# Patient Record
Sex: Female | Born: 2000 | Race: Black or African American | Hispanic: No | Marital: Single | Smoking: Never smoker
Health system: Southern US, Community
[De-identification: ages and names within clinical notes are randomized; demographics above are authoritative.]

## PROBLEM LIST (undated history)

## (undated) ENCOUNTER — Ambulatory Visit: Discharge status: Absent without leave | Payer: Medicaid Other

## (undated) DIAGNOSIS — D649 Anemia, unspecified: Secondary | ICD-10-CM

## (undated) HISTORY — PX: NO PAST SURGERIES: SHX2092

---
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
## 2022-06-27 IMAGING — US US OB TRANSVAGINAL
2 series · 15 of 28 positions shown · non-contrast
Comparison: Prior studies from [DATE] and 06/17/2020

CLINICAL DATA: Vaginal bleeding.  Positive pregnancy test.

EXAM:
OBSTETRIC <14 WK US AND TRANSVAGINAL OB US
TECHNIQUE: Both transabdominal and transvaginal ultrasound examinations were
performed for complete evaluation of the gestation as well as the
maternal uterus, adnexal regions, and pelvic cul-de-sac.
Transvaginal technique was performed to assess early pregnancy.

[Series 1: us ob transvaginal · 1 of 1 slices shown (1 of 2)]
[im 1/1]
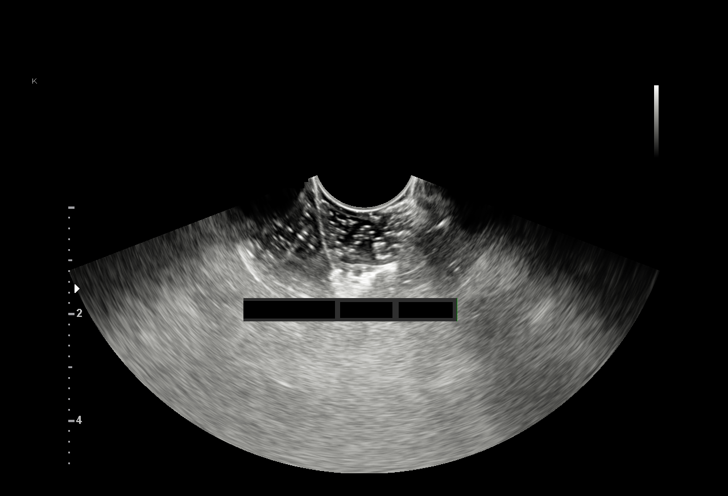

[Series 2: us ob transvaginal · 14 of 41 slices shown (2 of 2)]
[im 2/41]
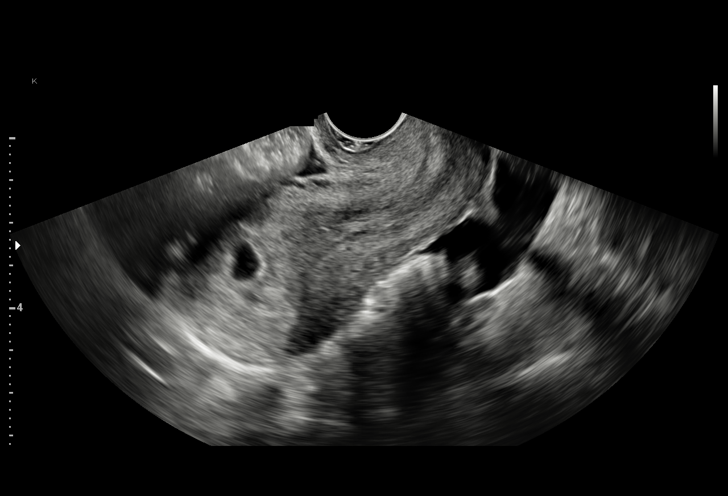
[im 5/41]
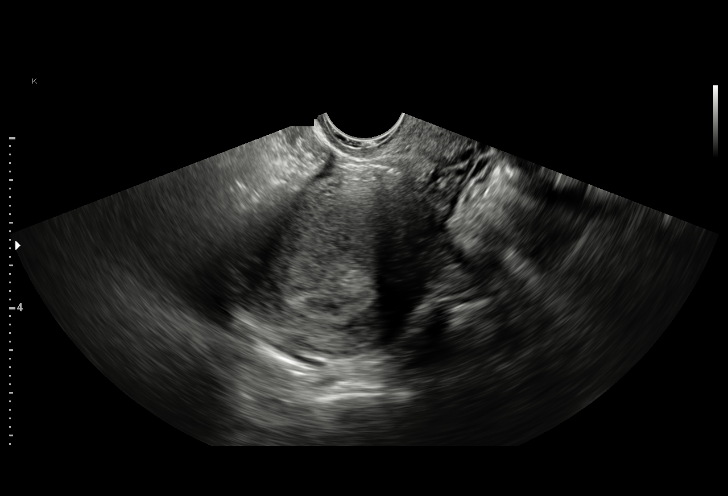
[im 8/41]
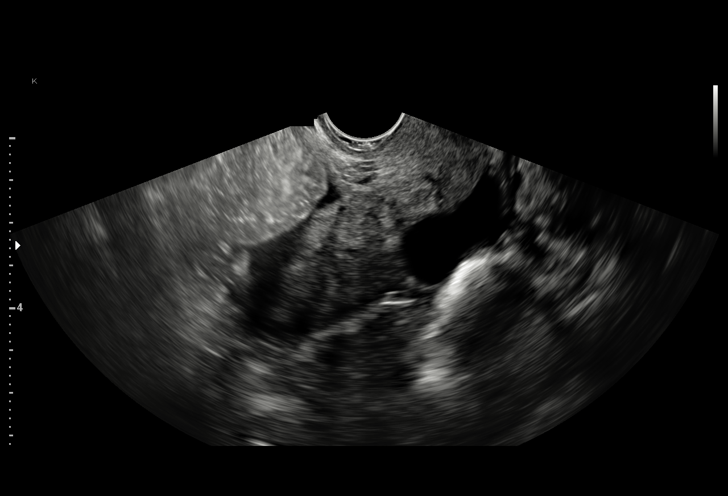
[im 11/41]
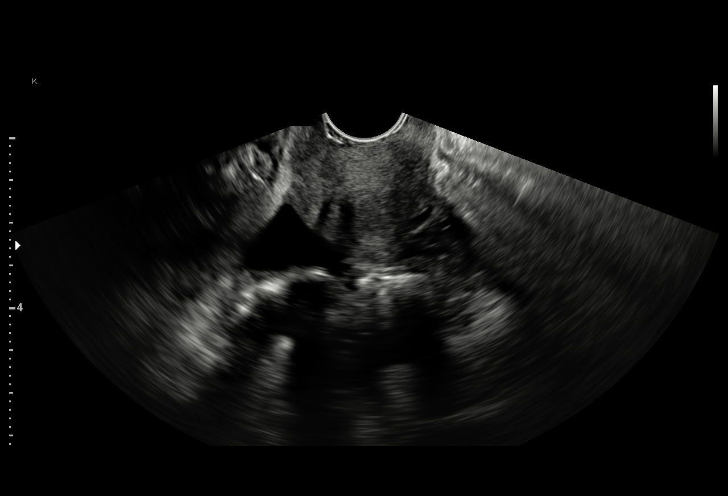
[im 14/41]
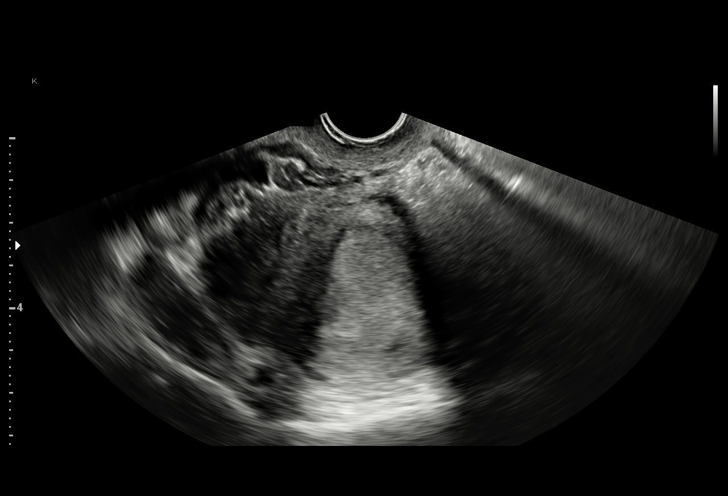
[im 17/41]
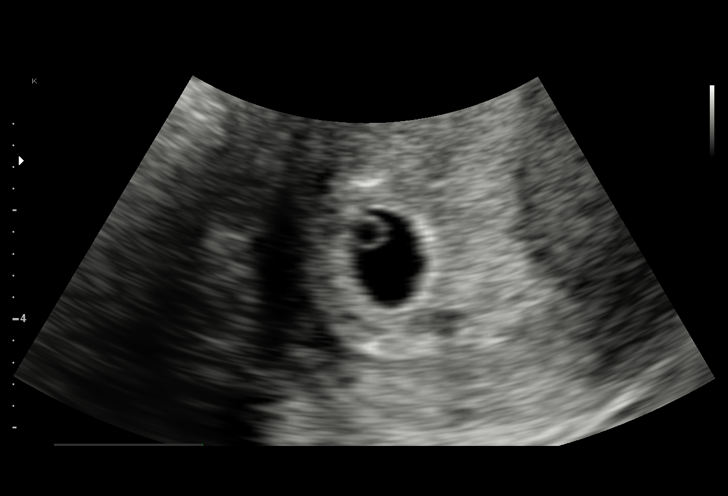
[im 21/41]
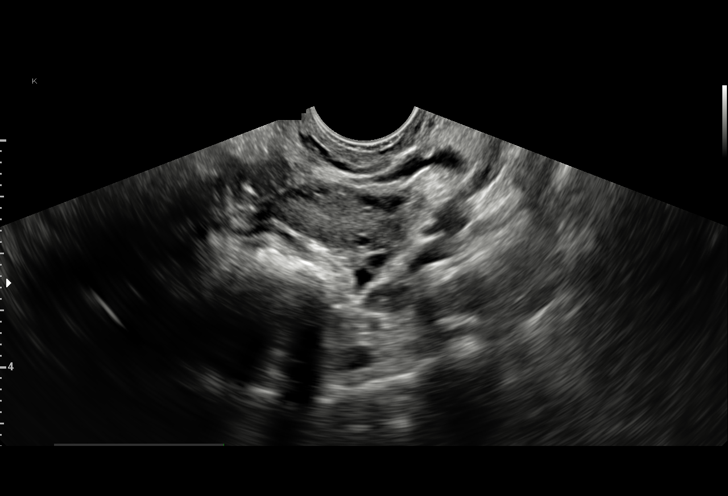
[im 22/41]
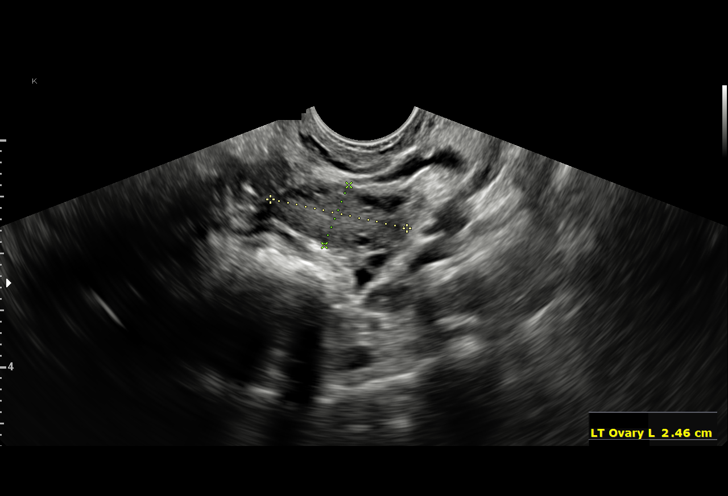
[im 25/41]
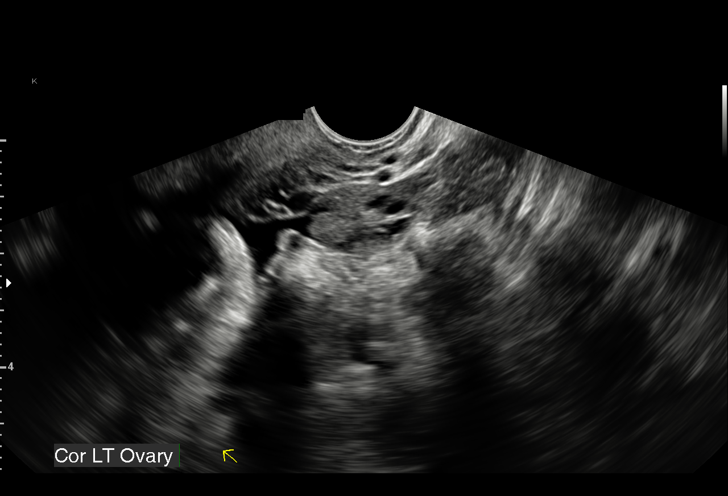
[im 28/41]
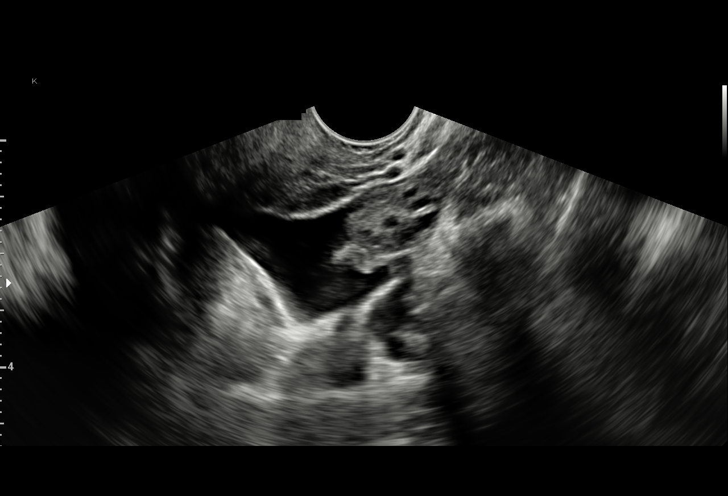
[im 31/41]
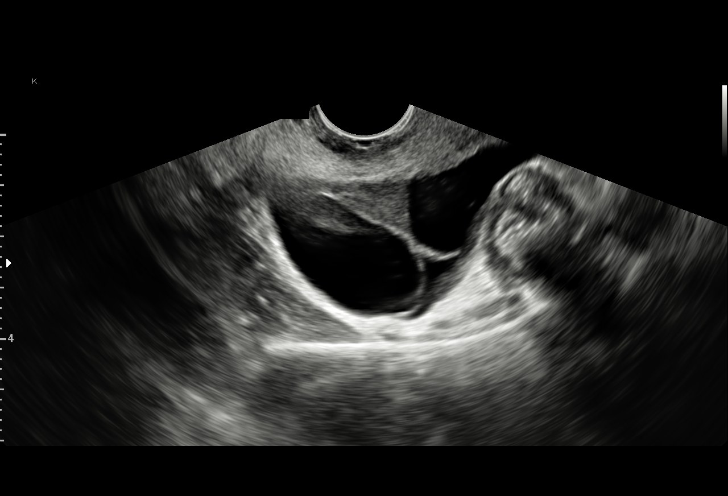
[im 34/41]
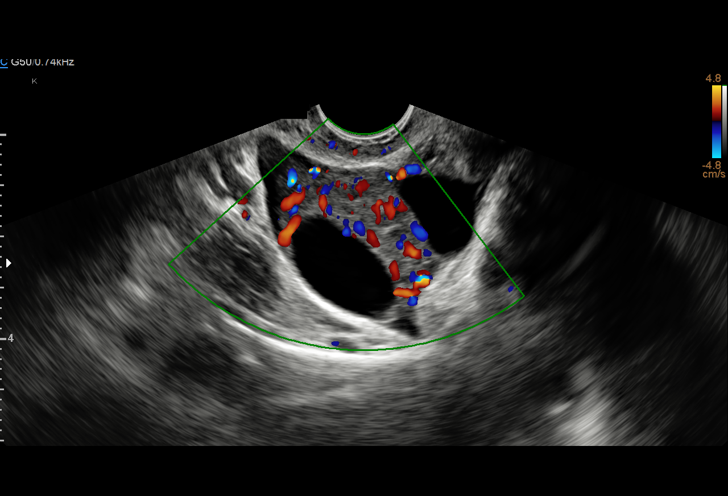
[im 37/41]
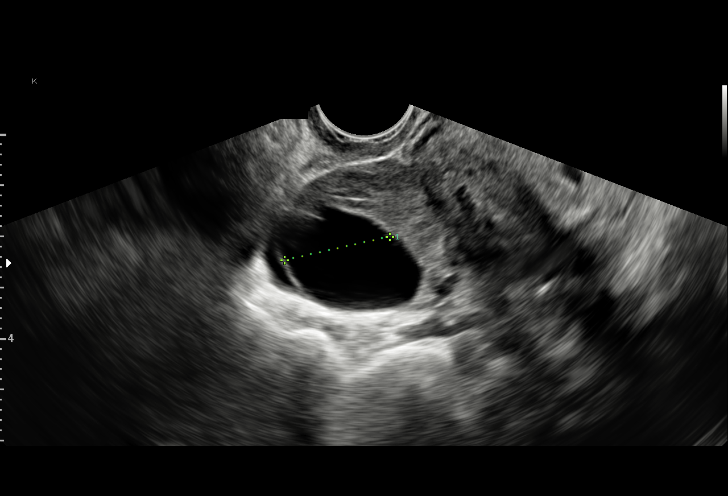
[im 41/41]
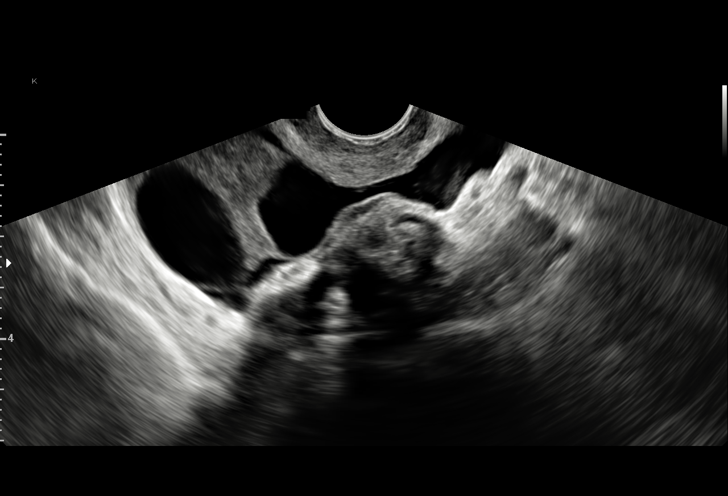

[15 of 28 positions shown; findings below may reference images not displayed]

FINDINGS: Intrauterine gestational sac: Single

Yolk sac:  Present

Embryo:  None

Cardiac Activity: N/A

Heart Rate: N/A bpm

MSD: 9.3 mm   5 w   5 d

Subchorionic hemorrhage:  None visualized.

Maternal uterus/adnexae: Right-sided corpus luteum cyst. The left
ovary is normal.

Moderate amount of free pelvic fluid.
IMPRESSION: 1. Intrauterine gestational sac estimated at 5 weeks and 5 days
gestation. Increase in size since prior studies. A yolk sac is
present but no embryonic pole is identified yet. Probable early
intrauterine gestational sac with small yolk sac but no embryonic
pole. Recommend follow-up quantitative B-HCG levels and follow-up US
in 14 days to assess viability. This recommendation follows SRU
consensus guidelines: Diagnostic Criteria for Nonviable Pregnancy
Early in the First Trimester. N Engl J Med 1334; [DATE].
2. Right-sided corpus luteum cyst.
3. Moderate free pelvic fluid.

## 2023-09-26 ENCOUNTER — Ambulatory Visit (HOSPITAL_COMMUNITY): Payer: Medicaid Other

## 2023-10-01 IMAGING — CR DG CHEST 2V
2 series · 2 of 2 positions shown · non-contrast
Comparison: None.

CLINICAL DATA: Pleuritic chest pain.

EXAM:
CHEST - 2 VIEW

[chest pa]
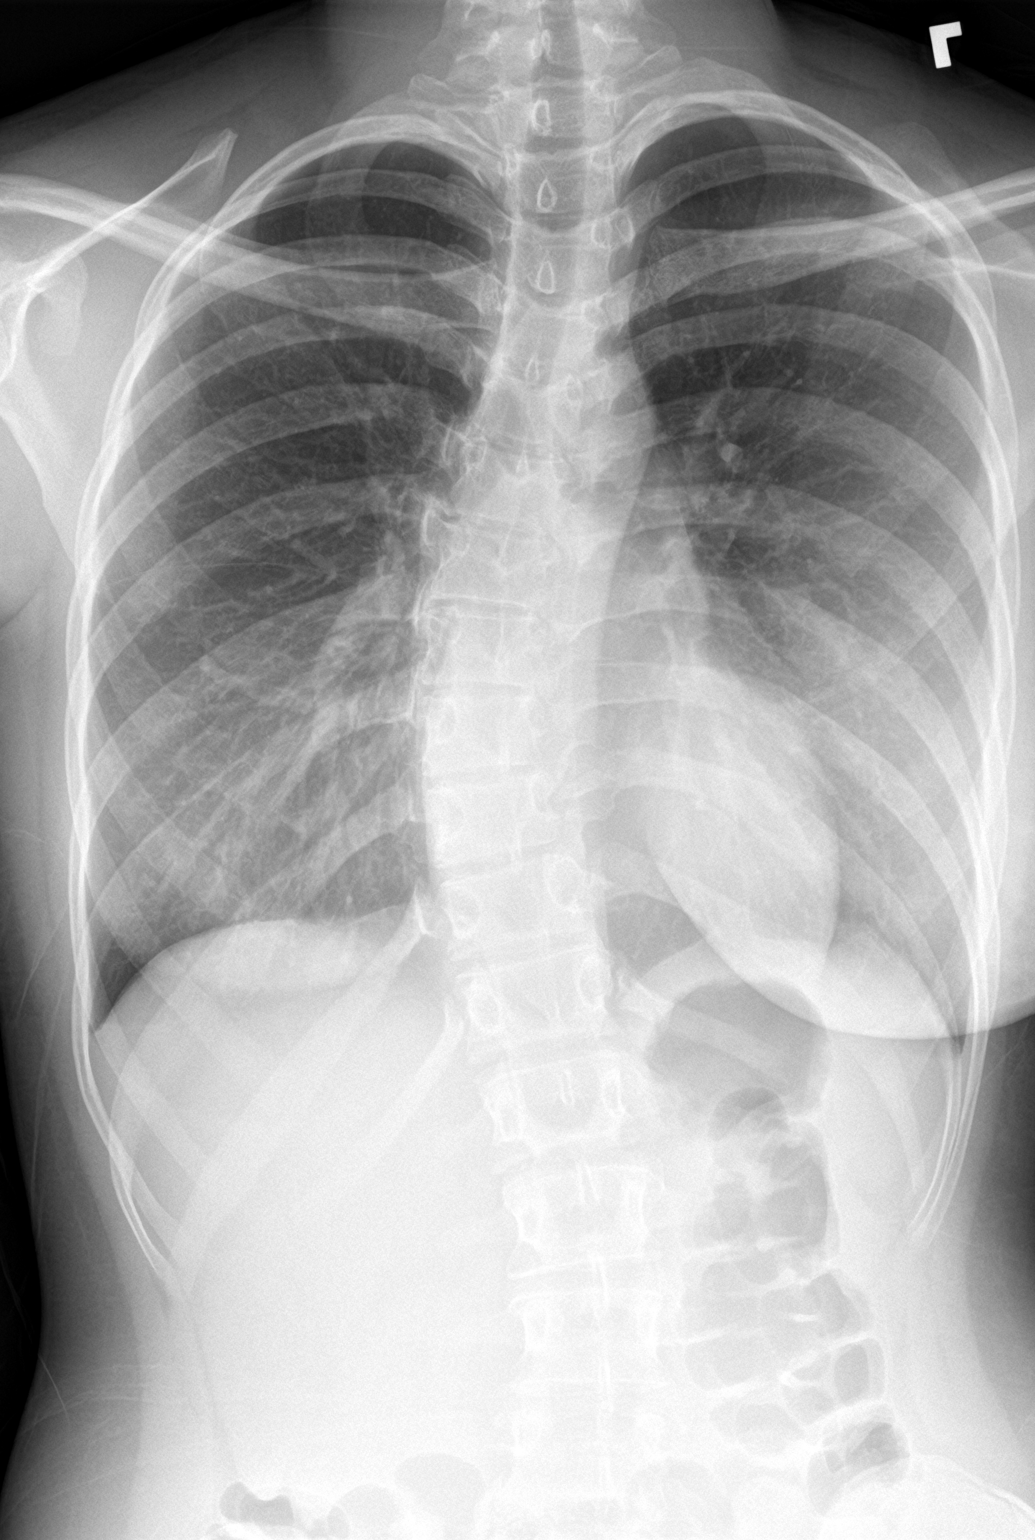

[chest lat]
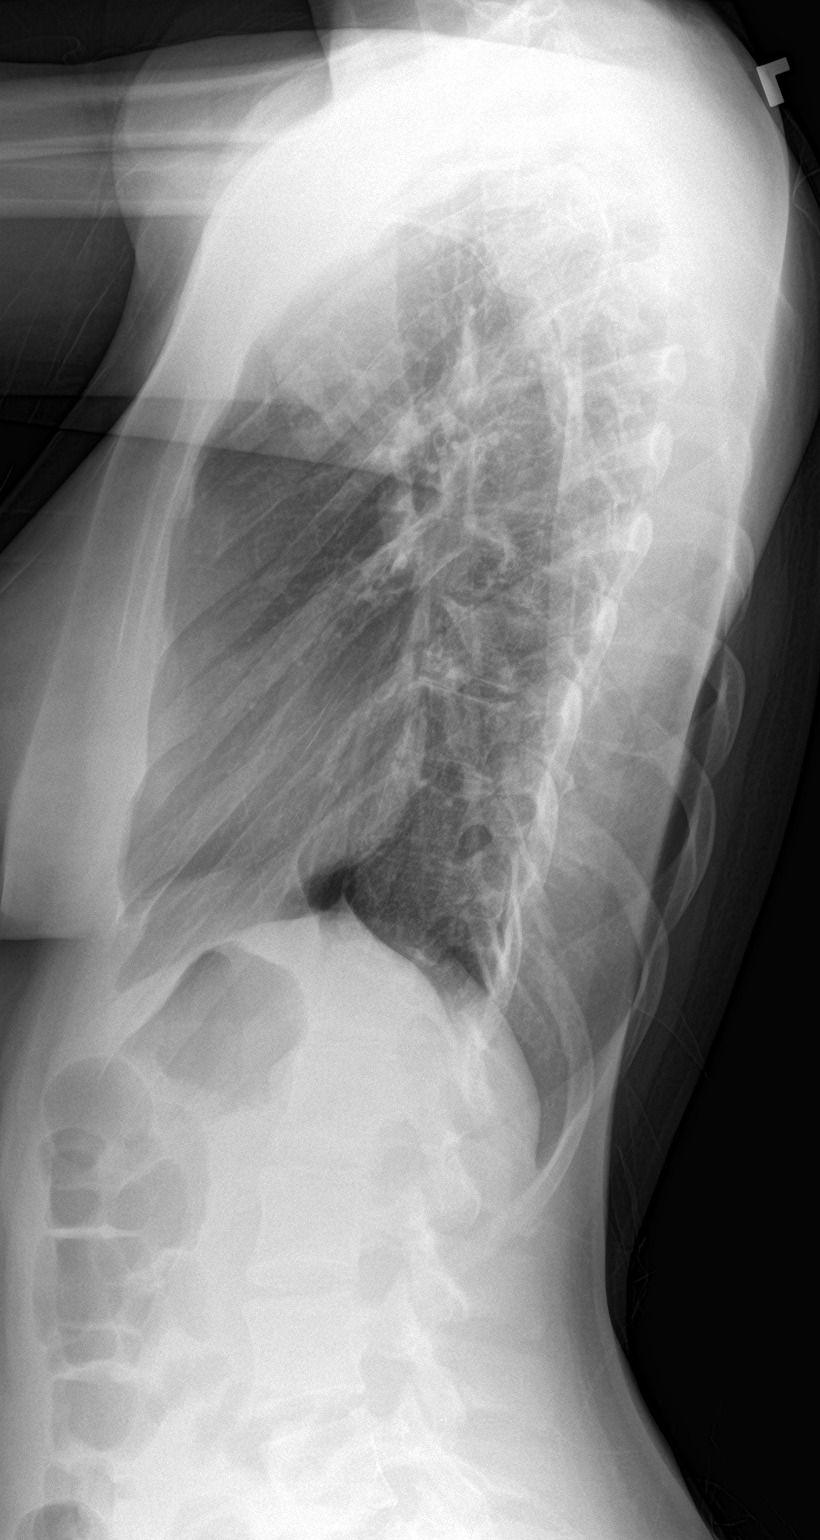

[2 of 2 positions shown; findings below may reference images not displayed]

FINDINGS: The heart size and mediastinal contours are within normal limits.
Both lungs are clear. There is dextroconvex curvature of the
thoracic spine.
IMPRESSION: No active cardiopulmonary disease.

## 2024-04-08 ENCOUNTER — Encounter (HOSPITAL_COMMUNITY): Payer: Self-pay

## 2024-04-08 ENCOUNTER — Emergency Department (HOSPITAL_COMMUNITY)
Admission: EM | Admit: 2024-04-08 | Discharge: 2024-04-09 | Discharge status: Against Medical Advice | Attending: Emergency Medicine | Admitting: Emergency Medicine

## 2024-04-08 ENCOUNTER — Other Ambulatory Visit: Payer: Self-pay

## 2024-04-08 DIAGNOSIS — Z5321 Procedure and treatment not carried out due to patient leaving prior to being seen by health care provider: Secondary | ICD-10-CM | POA: Insufficient documentation

## 2024-04-08 DIAGNOSIS — R040 Epistaxis: Secondary | ICD-10-CM | POA: Diagnosis present

## 2024-04-08 LAB — CBC
HCT: 30.8 % — ABNORMAL LOW (ref 36.0–46.0)
Hemoglobin: 9.9 g/dL — ABNORMAL LOW (ref 12.0–15.0)
MCH: 24.3 pg — ABNORMAL LOW (ref 26.0–34.0)
MCHC: 32.1 g/dL (ref 30.0–36.0)
MCV: 75.5 fL — ABNORMAL LOW (ref 80.0–100.0)
Platelets: 283 10*3/uL (ref 150–400)
RBC: 4.08 MIL/uL (ref 3.87–5.11)
RDW: 17.6 % — ABNORMAL HIGH (ref 11.5–15.5)
WBC: 4.9 10*3/uL (ref 4.0–10.5)
nRBC: 0 % (ref 0.0–0.2)

## 2024-04-08 NOTE — ED Triage Notes (Signed)
 Sudden nosebleed that manifested earlier today.   Says she has a hx of anemia requiring blood transfusion.   Encouraged to place pressure in correct area to stop bleeding.

## 2024-04-09 NOTE — ED Notes (Signed)
 Pt left.

## 2024-05-21 ENCOUNTER — Encounter: Admitting: Obstetrics and Gynecology
